# Patient Record
Sex: Female | Born: 1960 | Race: Black or African American | Hispanic: No | Marital: Married | Smoking: Never smoker
Health system: Southern US, Community
[De-identification: ages and names within clinical notes are randomized; demographics above are authoritative.]

## PROBLEM LIST (undated history)

## (undated) ENCOUNTER — Emergency Department (HOSPITAL_COMMUNITY): Admission: EM | Payer: BC Managed Care – PPO | Source: Home / Self Care

## (undated) DIAGNOSIS — E119 Type 2 diabetes mellitus without complications: Secondary | ICD-10-CM

## (undated) HISTORY — PX: ABDOMINAL HYSTERECTOMY: SHX81

---

## 2005-04-21 ENCOUNTER — Ambulatory Visit: Payer: Self-pay

## 2005-11-18 ENCOUNTER — Inpatient Hospital Stay: Payer: Self-pay | Admitting: Obstetrics & Gynecology

## 2006-04-20 ENCOUNTER — Ambulatory Visit: Payer: Self-pay | Admitting: Obstetrics & Gynecology

## 2007-04-24 ENCOUNTER — Ambulatory Visit: Payer: Self-pay

## 2007-07-26 ENCOUNTER — Ambulatory Visit: Payer: Self-pay | Admitting: Emergency Medicine

## 2008-02-08 ENCOUNTER — Ambulatory Visit: Payer: Self-pay

## 2009-02-10 ENCOUNTER — Ambulatory Visit: Payer: Self-pay

## 2010-02-16 ENCOUNTER — Ambulatory Visit: Payer: Self-pay

## 2011-03-29 ENCOUNTER — Ambulatory Visit: Payer: Self-pay | Admitting: Family Medicine

## 2012-04-26 ENCOUNTER — Ambulatory Visit: Payer: Self-pay | Admitting: Family Medicine

## 2013-03-31 ENCOUNTER — Emergency Department: Payer: Self-pay | Admitting: Emergency Medicine

## 2013-04-27 ENCOUNTER — Ambulatory Visit: Payer: Self-pay | Admitting: Family Medicine

## 2014-04-30 ENCOUNTER — Ambulatory Visit: Payer: Self-pay | Admitting: Family Medicine

## 2015-05-07 ENCOUNTER — Other Ambulatory Visit: Payer: Self-pay | Admitting: Family Medicine

## 2015-05-07 DIAGNOSIS — Z1231 Encounter for screening mammogram for malignant neoplasm of breast: Secondary | ICD-10-CM

## 2015-05-08 ENCOUNTER — Ambulatory Visit
Admission: RE | Admit: 2015-05-08 | Discharge: 2015-05-08 | Disposition: A | Discharge status: Home / Self Care | Payer: BLUE CROSS/BLUE SHIELD | Source: Ambulatory Visit | Attending: Family Medicine | Admitting: Family Medicine

## 2015-05-08 DIAGNOSIS — Z1231 Encounter for screening mammogram for malignant neoplasm of breast: Secondary | ICD-10-CM | POA: Insufficient documentation

## 2016-06-18 ENCOUNTER — Ambulatory Visit (INDEPENDENT_AMBULATORY_CARE_PROVIDER_SITE_OTHER): Payer: BLUE CROSS/BLUE SHIELD

## 2016-06-18 ENCOUNTER — Ambulatory Visit
Admission: EM | Admit: 2016-06-18 | Discharge: 2016-06-18 | Disposition: A | Discharge status: Home / Self Care | Payer: BLUE CROSS/BLUE SHIELD | Attending: Family Medicine | Admitting: Family Medicine

## 2016-06-18 ENCOUNTER — Encounter: Payer: Self-pay | Admitting: Emergency Medicine

## 2016-06-18 DIAGNOSIS — S86911A Strain of unspecified muscle(s) and tendon(s) at lower leg level, right leg, initial encounter: Secondary | ICD-10-CM

## 2016-06-18 DIAGNOSIS — M25561 Pain in right knee: Secondary | ICD-10-CM | POA: Diagnosis not present

## 2016-06-18 HISTORY — DX: Type 2 diabetes mellitus without complications: E11.9

## 2016-06-18 MED ORDER — INDOMETHACIN 50 MG PO CAPS: 50.0000 mg | ORAL_CAPSULE | Freq: Three times a day (TID) | ORAL | 0 refills | Status: AC | PRN

## 2016-06-18 MED ORDER — HYDROCODONE-ACETAMINOPHEN 5-325 MG PO TABS
ORAL_TABLET | ORAL | 0 refills | Status: AC
Start: 2016-06-18 — End: ?

## 2016-06-18 NOTE — ED Triage Notes (Signed)
Patient c/o pain and swelling in her right knee that started 3 weeks ago.  Patient states that she was walking and heard a pop in her knee and since then has not gotten better.

## 2016-06-18 NOTE — ED Provider Notes (Signed)
[facility]    CSN: [id number] Arrival date & time: 06/18/16  1739     History   Chief Complaint Chief Complaint  Patient presents with  . Knee Pain    right    HPI Evelyn Mcpherson is a 55 y.o. female.   55 yo female with a 3 weeks h/o right knee pain and swelling. States symptoms started after she was walking and heard a "pop" in her knee. Denies falls, twisting or direct trauma to the knee. Denies any fevers, chills, calf pain, rash.    The history is provided by the patient.    Past Medical History:  Diagnosis Date  . Diabetes mellitus without complication (HCC)     There are no active problems to display for this patient.   Past Surgical History:  Procedure Laterality Date  . ABDOMINAL HYSTERECTOMY      OB History    No data available       Home Medications    Prior to Admission medications   Medication Sig Start Date End Date Taking? Authorizing Provider  glipiZIDE (GLUCOTROL) 10 MG tablet Take 10 mg by mouth daily before breakfast.   Yes Historical Provider, MD  hydrochlorothiazide (HYDRODIURIL) 25 MG tablet Take 25 mg by mouth daily.   Yes Historical Provider, MD  lisinopril (PRINIVIL,ZESTRIL) 40 MG tablet Take 40 mg by mouth daily.   Yes Historical Provider, MD  metformin (FORTAMET) 1000 MG (OSM) 24 hr tablet Take 2,000 mg by mouth daily with breakfast.   Yes Historical Provider, MD  HYDROcodone-acetaminophen (NORCO/VICODIN) 5-325 MG tablet 1-2 po tid prn severe pain 06/18/16   Payton Mccallum, MD  indomethacin (INDOCIN) 50 MG capsule Take 1 capsule (50 mg total) by mouth 3 (three) times daily as needed. 06/18/16   Payton Mccallum, MD    Family History History reviewed. No pertinent family history.  Social History Social History  Substance Use Topics  . Smoking status: Not on file  . Smokeless tobacco: Never Used  . Alcohol use Yes     Allergies   Review of patient's allergies indicates no known allergies.   Review of  Systems Review of Systems   Physical Exam Triage Vital Signs ED Triage Vitals  Enc Vitals Group     BP 06/18/16 1753 (S) (!) 172/102     Pulse Rate 06/18/16 1753 75     Resp 06/18/16 1753 16     Temp 06/18/16 1753 98 F (36.7 C)     Temp Source 06/18/16 1753 Oral     SpO2 06/18/16 1753 100 %     Weight 06/18/16 1752 238 lb (108 kg)     Height 06/18/16 1752 5\' 10"  (1.778 m)     Head Circumference --      Peak Flow --      Pain Score 06/18/16 1756 5     Pain Loc --      Pain Edu? --      Excl. in [place]? --    No data found.   Updated Vital Signs BP (!) 160/90 (BP Location: Left Arm)   Pulse 80   Temp 98 F (36.7 C) (Oral)   Resp 16   Ht 5\' 10"  (1.778 m)   Wt 238 lb (108 kg)   SpO2 100%   BMI 34.15 kg/m   Visual Acuity Right Eye Distance:   Left Eye Distance:   Bilateral Distance:    Right Eye Near:   Left Eye Near:  Bilateral Near:     Physical Exam  Constitutional: She appears well-developed and well-nourished. No distress.  Musculoskeletal:       Right knee: She exhibits swelling (mild). She exhibits normal range of motion, no effusion, no ecchymosis, no deformity, no laceration, no erythema, normal alignment, no LCL laxity, normal patellar mobility, no bony tenderness, normal meniscus and no MCL laxity. Tenderness found. Medial joint line and MCL tenderness noted. No lateral joint line, no LCL and no patellar tendon tenderness noted.  Skin: She is not diaphoretic.  Nursing note and vitals reviewed.    UC Treatments / Results  Labs (all labs ordered are listed, but only abnormal results are displayed) Labs Reviewed - No data to display  EKG  EKG Interpretation None       Radiology Dg Knee Complete 4 Views Right  Result Date: 06/18/2016 CLINICAL DATA:  Right knee pain and swelling for 3 weeks EXAM: RIGHT KNEE - COMPLETE 4+ VIEW COMPARISON:  None. FINDINGS: Four views of the right knee submitted. No acute fracture or subluxation. There is mild  narrowing of medial joint compartment. Narrowing of patellofemoral joint space. Spurring of patella. Moderate joint effusion. IMPRESSION: No acute fracture or subluxation. Osteoarthritic changes as described above. Moderate joint effusion. Electronically Signed   By: Natasha MeadLiviu  Pop M.D.   On: 06/18/2016 18:50    Procedures Procedures (including critical care time)  Medications Ordered in UC Medications - No data to display   Initial Impression / Assessment and Plan / UC Course  I have reviewed the triage vital signs and the nursing notes.  Pertinent labs & imaging results that were available during my care of the patient were reviewed by me and considered in my medical decision making (see chart for details).  Clinical Course      Final Clinical Impressions(s) / UC Diagnoses   Final diagnoses:  Strain of right knee, initial encounter  Acute pain of right knee    New Prescriptions Discharge Medication List as of 06/18/2016  7:12 PM    START taking these medications   Details  HYDROcodone-acetaminophen (NORCO/VICODIN) 5-325 MG tablet 1-2 po tid prn severe pain, Print    indomethacin (INDOCIN) 50 MG capsule Take 1 capsule (50 mg total) by mouth 3 (three) times daily as needed., Starting Fri 06/18/2016, Normal       1. X-ray results and diagnosis reviewed with patient 2. rx as per orders above; reviewed possible side effects, interactions, risks and benefits   3. Recommend supportive treatment with rest, ice, elevation 4. Follow-up prn if symptoms worsen or don't improve   Payton Mccallumrlando Cleotis Sparr, MD 06/18/16 1950

## 2016-06-22 ENCOUNTER — Telehealth: Payer: Self-pay

## 2016-06-22 NOTE — Telephone Encounter (Signed)
Courtesy call back completed today for patient's recent visit at [facility]. Patient did not answer, left message on machine to call back with any questions or concerns.   

## 2016-07-02 ENCOUNTER — Other Ambulatory Visit: Payer: Self-pay | Admitting: Family Medicine

## 2016-07-02 DIAGNOSIS — Z1231 Encounter for screening mammogram for malignant neoplasm of breast: Secondary | ICD-10-CM

## 2016-07-08 ENCOUNTER — Ambulatory Visit
Admission: RE | Admit: 2016-07-08 | Discharge: 2016-07-08 | Disposition: A | Discharge status: Home / Self Care | Payer: BLUE CROSS/BLUE SHIELD | Source: Ambulatory Visit | Attending: Family Medicine | Admitting: Family Medicine

## 2016-07-08 DIAGNOSIS — Z1231 Encounter for screening mammogram for malignant neoplasm of breast: Secondary | ICD-10-CM | POA: Insufficient documentation

## 2017-07-04 ENCOUNTER — Other Ambulatory Visit: Payer: Self-pay | Admitting: Family Medicine

## 2017-07-04 DIAGNOSIS — Z1231 Encounter for screening mammogram for malignant neoplasm of breast: Secondary | ICD-10-CM

## 2017-07-20 ENCOUNTER — Ambulatory Visit: Payer: BLUE CROSS/BLUE SHIELD

## 2017-08-02 ENCOUNTER — Ambulatory Visit
Admission: RE | Admit: 2017-08-02 | Discharge: 2017-08-02 | Disposition: A | Discharge status: Home / Self Care | Payer: BLUE CROSS/BLUE SHIELD | Source: Ambulatory Visit | Attending: Family Medicine | Admitting: Family Medicine

## 2017-08-02 DIAGNOSIS — Z1231 Encounter for screening mammogram for malignant neoplasm of breast: Secondary | ICD-10-CM | POA: Insufficient documentation

## 2018-08-01 ENCOUNTER — Other Ambulatory Visit: Payer: Self-pay | Admitting: Family Medicine

## 2018-08-01 DIAGNOSIS — Z1231 Encounter for screening mammogram for malignant neoplasm of breast: Secondary | ICD-10-CM

## 2018-08-08 ENCOUNTER — Ambulatory Visit: Payer: BLUE CROSS/BLUE SHIELD

## 2018-08-09 ENCOUNTER — Encounter (INDEPENDENT_AMBULATORY_CARE_PROVIDER_SITE_OTHER): Payer: Self-pay

## 2018-08-09 ENCOUNTER — Ambulatory Visit
Admission: RE | Admit: 2018-08-09 | Discharge: 2018-08-09 | Disposition: A | Discharge status: Home / Self Care | Payer: BLUE CROSS/BLUE SHIELD | Source: Ambulatory Visit | Attending: Family Medicine | Admitting: Family Medicine

## 2018-08-09 DIAGNOSIS — Z1231 Encounter for screening mammogram for malignant neoplasm of breast: Secondary | ICD-10-CM | POA: Diagnosis not present

## 2019-07-23 ENCOUNTER — Other Ambulatory Visit: Payer: Self-pay | Admitting: Family Medicine

## 2019-07-23 DIAGNOSIS — Z1231 Encounter for screening mammogram for malignant neoplasm of breast: Secondary | ICD-10-CM

## 2019-08-13 ENCOUNTER — Ambulatory Visit
Admission: RE | Admit: 2019-08-13 | Discharge: 2019-08-13 | Disposition: A | Discharge status: Home / Self Care | Payer: BC Managed Care – PPO | Source: Ambulatory Visit | Attending: Family Medicine | Admitting: Family Medicine

## 2019-08-13 ENCOUNTER — Other Ambulatory Visit: Payer: Self-pay

## 2019-08-13 ENCOUNTER — Ambulatory Visit: Payer: BLUE CROSS/BLUE SHIELD

## 2019-08-13 DIAGNOSIS — Z1231 Encounter for screening mammogram for malignant neoplasm of breast: Secondary | ICD-10-CM | POA: Insufficient documentation

## 2019-08-15 ENCOUNTER — Ambulatory Visit
Admission: RE | Admit: 2019-08-15 | Discharge: 2019-08-15 | Disposition: A | Discharge status: Home / Self Care | Payer: BC Managed Care – PPO | Source: Ambulatory Visit | Attending: Family Medicine | Admitting: Family Medicine

## 2019-08-15 ENCOUNTER — Ambulatory Visit
Admission: EM | Admit: 2019-08-15 | Discharge: 2019-08-15 | Discharge status: Absent without leave | Payer: BC Managed Care – PPO

## 2019-08-15 ENCOUNTER — Ambulatory Visit
Admission: RE | Admit: 2019-08-15 | Discharge: 2019-08-15 | Disposition: A | Discharge status: Home / Self Care | Payer: BC Managed Care – PPO | Attending: Family Medicine | Admitting: Family Medicine

## 2019-08-15 ENCOUNTER — Other Ambulatory Visit: Payer: Self-pay

## 2019-08-15 ENCOUNTER — Other Ambulatory Visit: Payer: Self-pay | Admitting: Family Medicine

## 2019-08-15 DIAGNOSIS — M25562 Pain in left knee: Secondary | ICD-10-CM

## 2019-09-11 ENCOUNTER — Ambulatory Visit
Admission: EM | Admit: 2019-09-11 | Discharge: 2019-09-11 | Disposition: A | Discharge status: Home / Self Care | Payer: BC Managed Care – PPO | Attending: Family Medicine | Admitting: Family Medicine

## 2019-09-11 ENCOUNTER — Other Ambulatory Visit: Payer: Self-pay

## 2019-09-11 ENCOUNTER — Encounter: Payer: Self-pay | Admitting: Emergency Medicine

## 2019-09-11 DIAGNOSIS — U071 COVID-19: Secondary | ICD-10-CM

## 2019-09-11 LAB — RAPID INFLUENZA A&B ANTIGENS
Influenza A (ARMC): NEGATIVE
Influenza B (ARMC): NEGATIVE

## 2019-09-11 LAB — SARS CORONAVIRUS 2 AG (30 MIN TAT): SARS Coronavirus 2 Ag: POSITIVE — AB

## 2019-09-11 MED ORDER — ONDANSETRON 8 MG PO TBDP: 8.0000 mg | ORAL_TABLET | Freq: Three times a day (TID) | ORAL | 0 refills | Status: AC | PRN

## 2019-09-11 NOTE — ED Provider Notes (Signed)
[facility]    CSN: [id number] Arrival date & time: 09/11/19  1807      History   Chief Complaint Chief Complaint  Patient presents with  . Chills    HPI Evelyn Mcpherson is a 59 y.o. female.   59 yo female with a c/o chills and diarrhea for the past 3 days. Denies any vomiting, melena, hematochezia, cough, shortness of breath. Has been drinking pedialyte. States several covid positive co-workers but that she's not around them.      Past Medical History:  Diagnosis Date  . Diabetes mellitus without complication ([place])     There are no problems to display for this patient.   Past Surgical History:  Procedure Laterality Date  . ABDOMINAL HYSTERECTOMY      OB History   No obstetric history on file.      Home Medications    Prior to Admission medications   Medication Sig Start Date End Date Taking? Authorizing Provider  glipiZIDE (GLUCOTROL) 10 MG tablet Take 10 mg by mouth daily before breakfast.   Yes [provider]  hydrochlorothiazide (HYDRODIURIL) 25 MG tablet Take 25 mg by mouth daily.   Yes [provider]  HYDROcodone-acetaminophen (NORCO/VICODIN) 5-325 MG tablet 1-2 po tid prn severe pain 06/18/16  Yes Houa Nie, Linward Foster, MD  indomethacin (INDOCIN) 50 MG capsule Take 1 capsule (50 mg total) by mouth 3 (three) times daily as needed. 06/18/16  Yes Obryan Radu, Linward Foster, MD  lisinopril (PRINIVIL,ZESTRIL) 40 MG tablet Take 40 mg by mouth daily.   Yes [provider]  metformin (FORTAMET) 1000 MG (OSM) 24 hr tablet Take 2,000 mg by mouth daily with breakfast.   Yes [provider]  ondansetron (ZOFRAN ODT) 8 MG disintegrating tablet Take 1 tablet (8 mg total) by mouth every 8 (eight) hours as needed. 09/11/19   Norval Gable, MD    Family History Family History  Problem Relation Age of Onset  . Breast cancer Neg Hx     Social History Social History   Tobacco Use  . Smoking status: Never Smoker  . Smokeless  tobacco: Never Used  Substance Use Topics  . Alcohol use: Yes  . Drug use: No     Allergies   Patient has no known allergies.   Review of Systems Review of Systems   Physical Exam Triage Vital Signs ED Triage Vitals  Enc Vitals Group     BP 09/11/19 1850 139/78     Pulse Rate 09/11/19 1850 (!) 104     Resp 09/11/19 1850 18     Temp 09/11/19 1850 (!) 101.8 F (38.8 C)     Temp Source 09/11/19 1850 Oral     SpO2 09/11/19 1850 98 %     Weight 09/11/19 1853 220 lb (99.8 kg)     Height 09/11/19 1853 5\' 9"  (1.753 m)     Head Circumference --      Peak Flow --      Pain Score 09/11/19 1849 0     Pain Loc --      Pain Edu? --      Excl. in [place]? --    No data found.  Updated Vital Signs BP 139/78 (BP Location: Right Arm)   Pulse (!) 104   Temp (!) 101.8 F (38.8 C) (Oral)   Resp 18   Ht 5\' 9"  (1.753 m)   Wt 99.8 kg   SpO2 98%   BMI 32.49 kg/m   Visual Acuity Right Eye  Distance:   Left Eye Distance:   Bilateral Distance:    Right Eye Near:   Left Eye Near:    Bilateral Near:     Physical Exam Vitals and nursing note reviewed.  Constitutional:      General: She is not in acute distress.    Appearance: She is not diaphoretic.  Cardiovascular:     Rate and Rhythm: Tachycardia present.  Pulmonary:     Effort: Pulmonary effort is normal. No respiratory distress.  Abdominal:     General: Bowel sounds are normal. There is no distension.     Palpations: Abdomen is soft.     Tenderness: There is no abdominal tenderness. There is no guarding or rebound.  Neurological:     Mental Status: She is alert.      UC Treatments / Results  Labs (all labs ordered are listed, but only abnormal results are displayed) Labs Reviewed  SARS CORONAVIRUS 2 AG (30 MIN TAT) - Abnormal; Notable for the following components:      Result Value   SARS Coronavirus 2 Ag POSITIVE (*)    All other components within normal limits  RAPID INFLUENZA A&B ANTIGENS     EKG   Radiology No results found.  Procedures Procedures (including critical care time)  Medications Ordered in UC Medications - No data to display  Initial Impression / Assessment and Plan / UC Course  I have reviewed the triage vital signs and the nursing notes.  Pertinent labs & imaging results that were available during my care of the patient were reviewed by me and considered in my medical decision making (see chart for details).      Final Clinical Impressions(s) / UC Diagnoses   Final diagnoses:  COVID-19 virus infection     Discharge Instructions     Rest, fluids, over the counter Imodium AD    ED Prescriptions    Medication Sig Dispense Auth. Provider   ondansetron (ZOFRAN ODT) 8 MG disintegrating tablet Take 1 tablet (8 mg total) by mouth every 8 (eight) hours as needed. 6 tablet Payton Mccallum, MD      1. Lab result and diagnosis reviewed with patient 2. rx as per orders above; reviewed possible side effects, interactions, risks and benefits  3. Recommend supportive treatment as above 4. Follow-up prn if symptoms worsen or don't improve PDMP not reviewed this encounter.   Payton Mccallum, MD 09/11/19 [phone]

## 2019-09-11 NOTE — Discharge Instructions (Signed)
Rest, fluids, over the counter Imodium AD 

## 2019-09-11 NOTE — ED Triage Notes (Signed)
Patient c/o chills and diarrhea that started on Sunday. She states the diarrhea has resolved but she continues to have the chills and feeling like she can't drink enough. She has been drinking Pedialyte.

## 2020-07-30 ENCOUNTER — Other Ambulatory Visit: Payer: Self-pay | Admitting: Family Medicine

## 2020-07-30 DIAGNOSIS — Z1231 Encounter for screening mammogram for malignant neoplasm of breast: Secondary | ICD-10-CM

## 2020-08-13 ENCOUNTER — Ambulatory Visit: Payer: BC Managed Care – PPO

## 2020-08-14 ENCOUNTER — Ambulatory Visit
Admission: RE | Admit: 2020-08-14 | Discharge: 2020-08-14 | Disposition: A | Discharge status: Home / Self Care | Payer: BC Managed Care – PPO | Source: Ambulatory Visit | Attending: Family Medicine | Admitting: Family Medicine

## 2020-08-14 ENCOUNTER — Other Ambulatory Visit: Payer: Self-pay

## 2020-08-14 DIAGNOSIS — Z1231 Encounter for screening mammogram for malignant neoplasm of breast: Secondary | ICD-10-CM | POA: Insufficient documentation

## 2021-08-05 ENCOUNTER — Other Ambulatory Visit: Payer: Self-pay | Admitting: Family Medicine

## 2021-08-05 DIAGNOSIS — Z1231 Encounter for screening mammogram for malignant neoplasm of breast: Secondary | ICD-10-CM

## 2021-08-17 ENCOUNTER — Other Ambulatory Visit: Payer: Self-pay

## 2021-08-17 ENCOUNTER — Ambulatory Visit
Admission: RE | Admit: 2021-08-17 | Discharge: 2021-08-17 | Disposition: A | Discharge status: Home / Self Care | Payer: BC Managed Care – PPO | Source: Ambulatory Visit | Attending: Family Medicine | Admitting: Family Medicine

## 2021-08-17 DIAGNOSIS — Z1231 Encounter for screening mammogram for malignant neoplasm of breast: Secondary | ICD-10-CM | POA: Insufficient documentation

## 2022-01-25 IMAGING — MG MM DIGITAL SCREENING BILAT W/ TOMO AND CAD
8 series · 8 of 24 positions shown · non-contrast
Comparison: Previous exam(s).

CLINICAL DATA: Screening.

EXAM:
DIGITAL SCREENING BILATERAL MAMMOGRAM WITH TOMOSYNTHESIS AND CAD
TECHNIQUE: Bilateral screening digital craniocaudal and mediolateral oblique
mammograms were obtained. Bilateral screening digital breast
tomosynthesis was performed. The images were evaluated with
computer-aided detection.

[R MLO synth-2D]
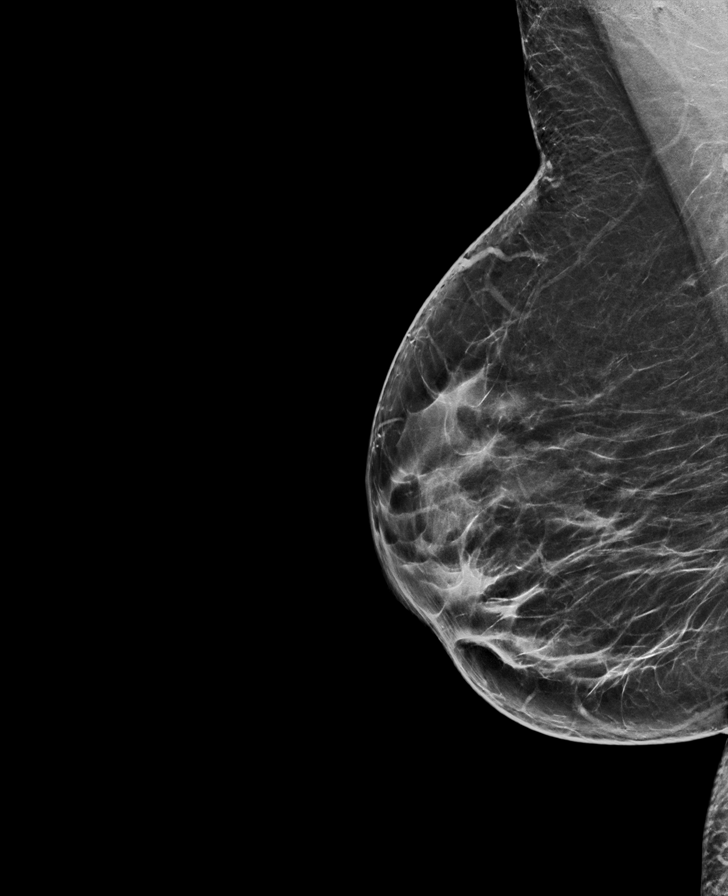

[L CC synth-2D]
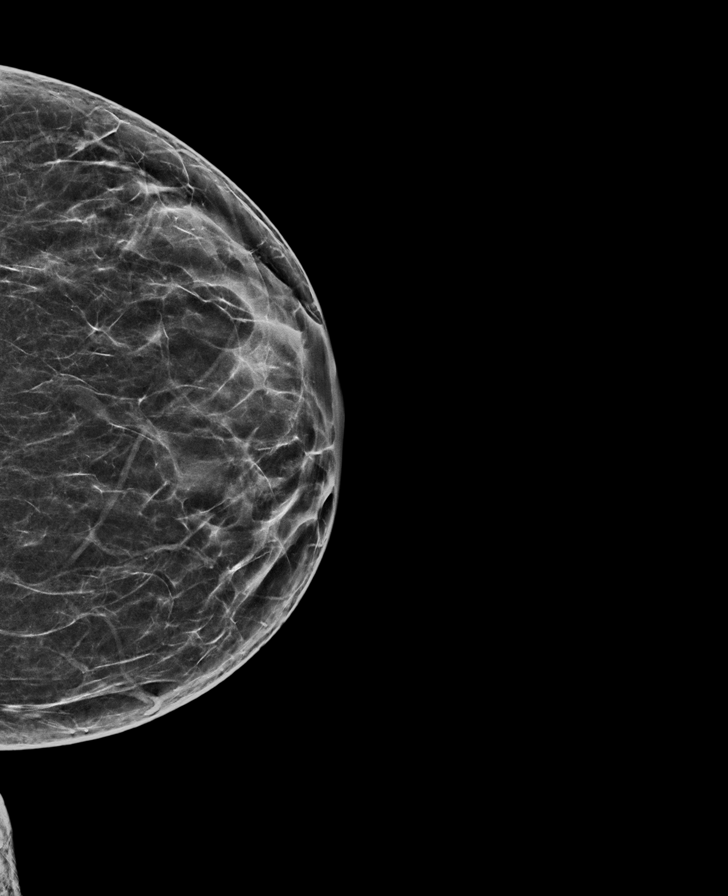

[L MLO synth-2D]
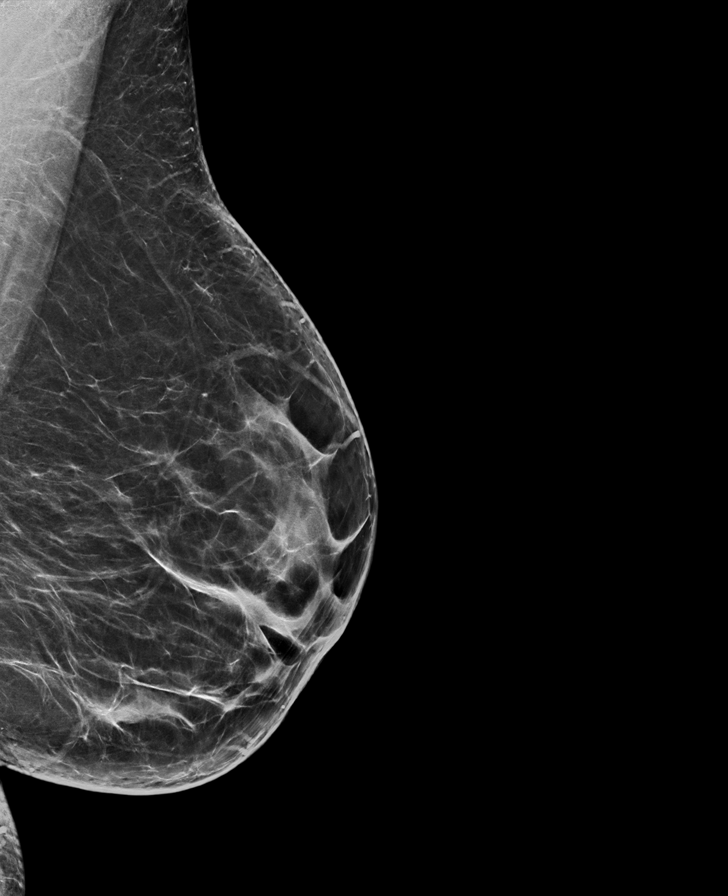

[R CC synth-2D]
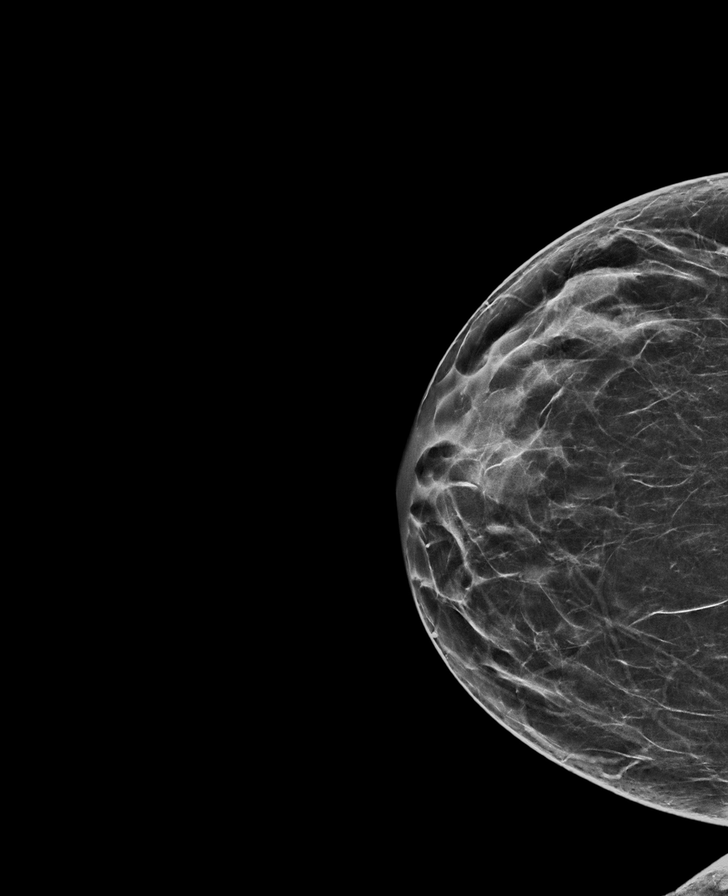

[R CC tomo · tomo slice 31/62.0]
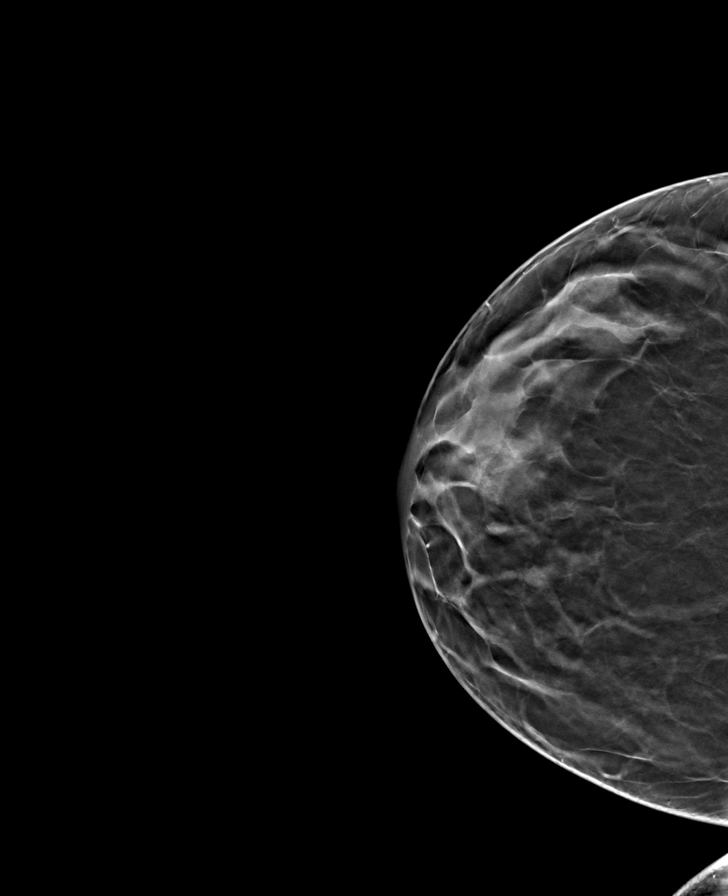

[L MLO tomo · tomo slice 39/78.0]
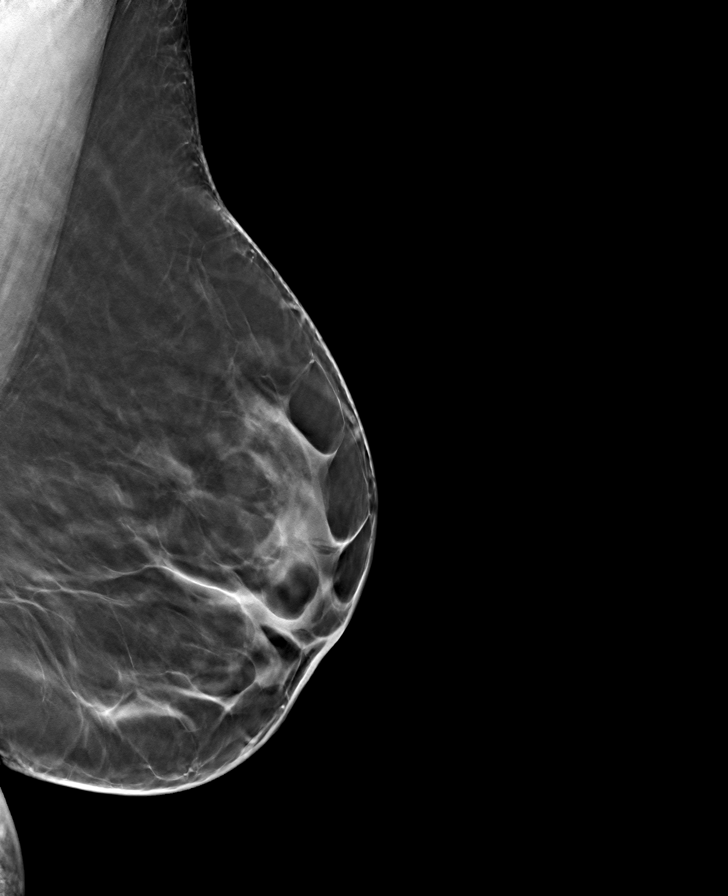

[L CC tomo · tomo slice 33/65.0]
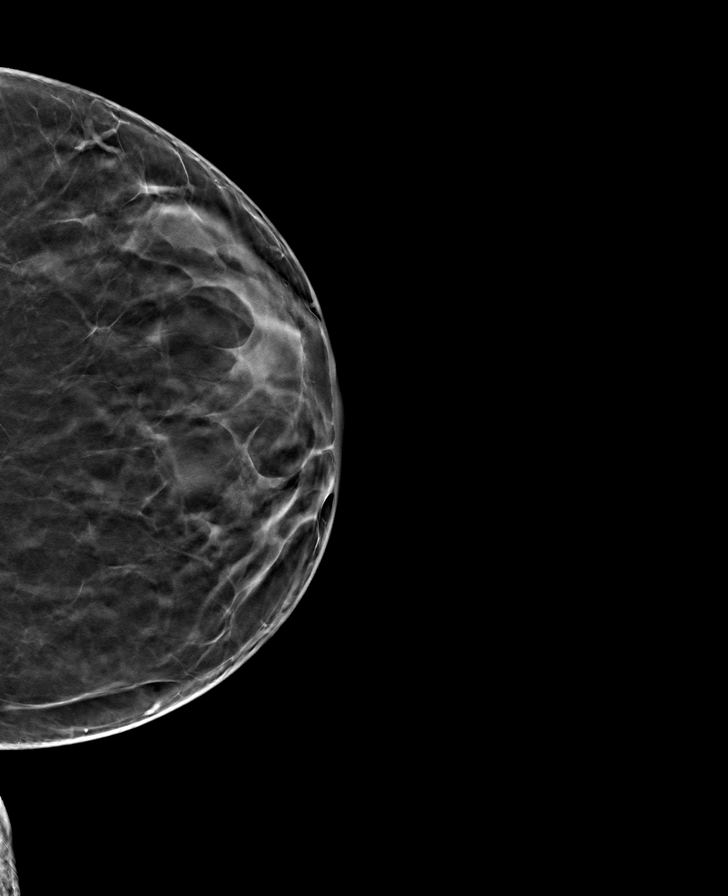

[R MLO tomo · tomo slice 37/72.0]
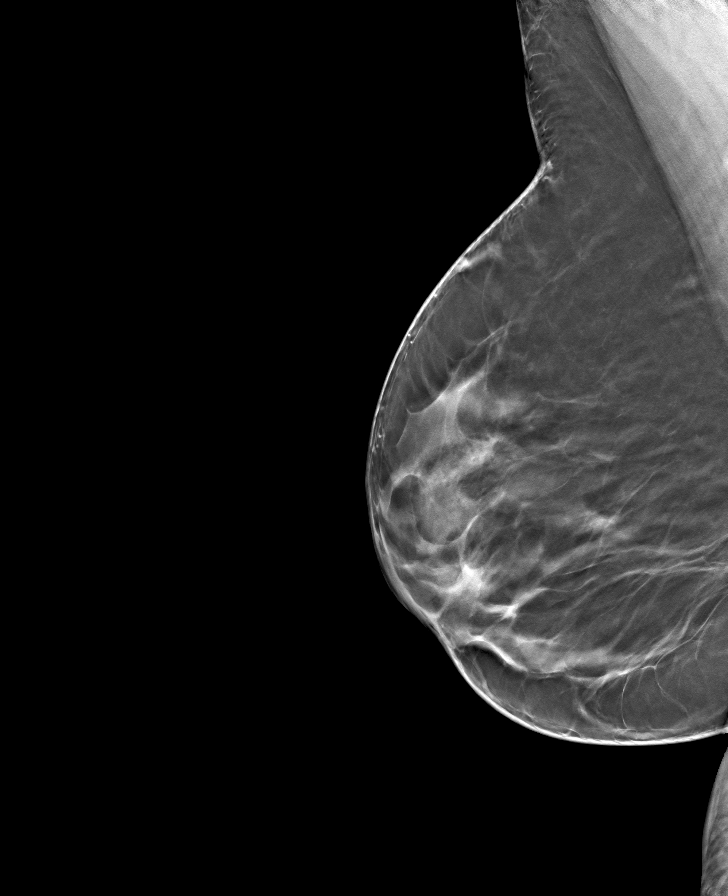

[8 of 24 positions shown; findings below may reference images not displayed]

ACR Breast Density Category b: There are scattered areas of
fibroglandular density.
FINDINGS: There are no findings suspicious for malignancy.
IMPRESSION: No mammographic evidence of malignancy. A result letter of this
screening mammogram will be mailed directly to the patient.

RECOMMENDATION:
Screening mammogram in one year. (Code:51-O-LD2)

BI-RADS CATEGORY  1: Negative.

## 2022-07-12 ENCOUNTER — Other Ambulatory Visit: Payer: Self-pay | Admitting: Family Medicine

## 2022-07-12 DIAGNOSIS — Z1231 Encounter for screening mammogram for malignant neoplasm of breast: Secondary | ICD-10-CM

## 2022-08-18 ENCOUNTER — Ambulatory Visit
Admission: RE | Admit: 2022-08-18 | Discharge: 2022-08-18 | Disposition: A | Discharge status: Home / Self Care | Payer: BC Managed Care – PPO | Source: Ambulatory Visit | Attending: Family Medicine | Admitting: Family Medicine

## 2022-08-18 DIAGNOSIS — Z1231 Encounter for screening mammogram for malignant neoplasm of breast: Secondary | ICD-10-CM | POA: Diagnosis present

## 2023-08-09 ENCOUNTER — Other Ambulatory Visit: Payer: Self-pay | Admitting: Family Medicine

## 2023-08-09 DIAGNOSIS — Z1231 Encounter for screening mammogram for malignant neoplasm of breast: Secondary | ICD-10-CM

## 2023-08-25 ENCOUNTER — Ambulatory Visit
Admission: RE | Admit: 2023-08-25 | Discharge: 2023-08-25 | Disposition: A | Discharge status: Home / Self Care | Payer: BC Managed Care – PPO | Source: Ambulatory Visit | Attending: Family Medicine | Admitting: Family Medicine

## 2023-08-25 DIAGNOSIS — Z1231 Encounter for screening mammogram for malignant neoplasm of breast: Secondary | ICD-10-CM | POA: Insufficient documentation

## 2024-07-31 ENCOUNTER — Other Ambulatory Visit: Payer: Self-pay | Admitting: Family Medicine

## 2024-07-31 DIAGNOSIS — Z1231 Encounter for screening mammogram for malignant neoplasm of breast: Secondary | ICD-10-CM

## 2024-09-05 ENCOUNTER — Ambulatory Visit
Admission: RE | Admit: 2024-09-05 | Discharge: 2024-09-05 | Disposition: A | Discharge status: Home / Self Care | Source: Ambulatory Visit | Attending: Family Medicine | Admitting: Family Medicine

## 2024-09-05 DIAGNOSIS — Z1231 Encounter for screening mammogram for malignant neoplasm of breast: Secondary | ICD-10-CM | POA: Diagnosis present
